# Patient Record
Sex: Male | Born: 1955 | Race: White | Hispanic: No | Marital: Married | Smoking: Former smoker
Health system: Southern US, Community
[De-identification: ages and names within clinical notes are randomized; demographics above are authoritative.]

## PROBLEM LIST (undated history)

## (undated) DIAGNOSIS — I2510 Atherosclerotic heart disease of native coronary artery without angina pectoris: Secondary | ICD-10-CM

## (undated) HISTORY — PX: PERCUTANEOUS CORONARY STENT INTERVENTION (PCI-S): SHX6016

## (undated) HISTORY — PX: HERNIA REPAIR: SHX51

---

## 2004-09-28 ENCOUNTER — Ambulatory Visit: Payer: Self-pay | Admitting: Gastroenterology

## 2004-11-17 ENCOUNTER — Ambulatory Visit: Payer: Self-pay | Admitting: Gastroenterology

## 2005-08-22 ENCOUNTER — Other Ambulatory Visit: Payer: Self-pay

## 2005-08-22 ENCOUNTER — Emergency Department: Payer: Self-pay | Admitting: Internal Medicine

## 2005-09-06 ENCOUNTER — Encounter: Payer: Self-pay | Admitting: Family Medicine

## 2005-09-15 ENCOUNTER — Encounter: Payer: Self-pay | Admitting: Family Medicine

## 2005-10-16 ENCOUNTER — Encounter: Payer: Self-pay | Admitting: Family Medicine

## 2007-05-08 ENCOUNTER — Ambulatory Visit: Payer: Self-pay | Admitting: Family Medicine

## 2008-09-23 ENCOUNTER — Ambulatory Visit: Payer: Self-pay | Admitting: Family Medicine

## 2008-09-25 ENCOUNTER — Ambulatory Visit: Payer: Self-pay | Admitting: Family Medicine

## 2014-01-03 ENCOUNTER — Ambulatory Visit: Payer: Self-pay | Admitting: Gastroenterology

## 2014-01-14 ENCOUNTER — Ambulatory Visit: Payer: Self-pay | Admitting: Gastroenterology

## 2014-01-29 ENCOUNTER — Ambulatory Visit: Payer: Self-pay | Admitting: Gastroenterology

## 2014-03-21 ENCOUNTER — Ambulatory Visit: Payer: Self-pay | Admitting: Cardiology

## 2016-05-10 ENCOUNTER — Encounter: Payer: Self-pay | Admitting: *Deleted

## 2016-05-10 ENCOUNTER — Ambulatory Visit (INDEPENDENT_AMBULATORY_CARE_PROVIDER_SITE_OTHER): Payer: Worker's Compensation

## 2016-05-10 ENCOUNTER — Ambulatory Visit
Admission: EM | Admit: 2016-05-10 | Discharge: 2016-05-10 | Disposition: A | Discharge status: Home / Self Care | Payer: Worker's Compensation

## 2016-05-10 DIAGNOSIS — M4316 Spondylolisthesis, lumbar region: Secondary | ICD-10-CM

## 2016-05-10 DIAGNOSIS — M5416 Radiculopathy, lumbar region: Secondary | ICD-10-CM | POA: Diagnosis not present

## 2016-05-10 HISTORY — DX: Atherosclerotic heart disease of native coronary artery without angina pectoris: I25.10

## 2016-05-10 MED ORDER — KETOROLAC TROMETHAMINE 60 MG/2ML IM SOLN
60.0000 mg | Freq: Once | INTRAMUSCULAR | Status: AC
  Administered 2016-05-10: 60 mg via INTRAMUSCULAR

## 2016-05-10 MED ORDER — NAPROXEN 500 MG PO TABS: 500.0000 mg | ORAL_TABLET | Freq: Two times a day (BID) | ORAL | 0 refills | Status: DC

## 2016-05-10 MED ORDER — TIZANIDINE HCL 4 MG PO TABS: 4.0000 mg | ORAL_TABLET | Freq: Four times a day (QID) | ORAL | 0 refills | Status: DC | PRN

## 2016-05-10 NOTE — ED Triage Notes (Signed)
Sudden onset low back pain while lifting a chair at work last week. Pain has been persistent through week but became worse last night and this morning. Pain is left sided and radiates to left leg.

## 2016-05-11 NOTE — ED Provider Notes (Signed)
CSN: [id number]     Arrival date & time 05/10/16  4098 History   First MD Initiated Contact with Patient 05/10/16 [phone]     Chief Complaint  Patient presents with  . Back Pain   (Consider location/radiation/quality/duration/timing/severity/associated sxs/prior Treatment) HPI  Patient presents with 1 week H/O LBP with radiation into his left leg. Was lifting a couch at work when plastic covering slipped and  he strained to prevent it from falling as he was attempting to stand the couch on end. Since then he has had the back pain radiating into his left leg. Denies incontinence.      Past Medical History:  Diagnosis Date  . Coronary artery disease    Past Surgical History:  Procedure Laterality Date  . HERNIA REPAIR    . PERCUTANEOUS CORONARY STENT INTERVENTION (PCI-S)     Family History  Problem Relation Age of Onset  . Osteoarthritis Mother   . Cancer Father   . Stroke Father    Social History  Substance Use Topics  . Smoking status: Former Games developer  . Smokeless tobacco: Never Used  . Alcohol use No    Review of Systems  Constitutional: Positive for activity change. Negative for appetite change, chills, fatigue and fever.  Eyes: Positive for photophobia.  Musculoskeletal: Positive for back pain and myalgias.  All other systems reviewed and are negative.   Allergies  Review of patient's allergies indicates no known allergies.  Home Medications   Prior to Admission medications   Medication Sig Start Date End Date Taking? Authorizing Provider  aspirin EC 81 MG tablet Take 81 mg by mouth daily.   Yes Historical Provider, MD  fenofibrate (TRICOR) 48 MG tablet Take 48 mg by mouth daily.   Yes Historical Provider, MD  rosuvastatin (CRESTOR) 20 MG tablet Take 20 mg by mouth daily.   Yes Historical Provider, MD  naproxen (NAPROSYN) 500 MG tablet Take 1 tablet (500 mg total) by mouth 2 (two) times daily with a meal. 05/10/16   Lutricia Feil, PA-C  tiZANidine (ZANAFLEX) 4  MG tablet Take 1 tablet (4 mg total) by mouth every 6 (six) hours as needed for muscle spasms. 05/10/16   Lutricia Feil, PA-C   Meds Ordered and Administered this Visit   Medications  ketorolac (TORADOL) injection 60 mg (60 mg Intramuscular Given 05/10/16 0959)    BP (!) 150/85 (BP Location: Left Arm)   Pulse 82   Temp 97.7 F (36.5 C) (Oral)   Resp 16   Ht 5\' 10"  (1.778 m)   Wt 197 lb (89.4 kg)   SpO2 99%   BMI 28.27 kg/m  No data found.   Physical Exam  Constitutional: He appears well-developed and well-nourished. No distress.  HENT:  Head: Normocephalic and atraumatic.  Eyes: EOM are normal. Pupils are equal, round, and reactive to light.  Neck: Normal range of motion. Neck supple.  Musculoskeletal: He exhibits tenderness.  Level pelvis in stance. Good ROM. Muscles strong. SLR positive at 80 degrees on left with leg radiation. DTR 2/4 symmetrical.  Skin: He is not diaphoretic.  Nursing note and vitals reviewed.   Urgent Care Course   Clinical Course    Procedures (including critical care time)  Labs Review Labs Reviewed - No data to display  Imaging Review Dg Lumbar Spine Complete  Result Date: 05/10/2016 CLINICAL DATA:  Left lumbar radiculopathy EXAM: LUMBAR SPINE - COMPLETE 4+ VIEW COMPARISON:  09/25/2008 FINDINGS: Grade 1 anterior listhesis L4-5 has progressed in the  interval. This appears degenerative without pars defect identified. Remaining alignment normal. Schmorl's node superior endplate of L1 unchanged. Disc calcification L1-2 unchanged. Anterior spurring L2-3 and L3-4 unchanged. Mild disc space narrowing L5-S1. Negative for fracture or mass.  No pars defect. IMPRESSION: Progressive anterior listhesis at L4-5 due to disc and facet degeneration. Negative for fracture. Electronically Signed   By: Marlan Palauharles  Clark M.D.   On: 05/10/2016 10:35     Visual Acuity Review  Right Eye Distance:   Left Eye Distance:   Bilateral Distance:    Right Eye Near:    Left Eye Near:    Bilateral Near:     Medications  ketorolac (TORADOL) injection 60 mg (60 mg Intramuscular Given 05/10/16 0959)      MDM   1. Acute left lumbar radiculopathy   2. Spondylolisthesis of lumbar region    Discharge Medication List as of 05/10/2016 11:16 AM    START taking these medications   Details  naproxen (NAPROSYN) 500 MG tablet Take 1 tablet (500 mg total) by mouth 2 (two) times daily with a meal., Starting Tue 05/10/2016, Normal    tiZANidine (ZANAFLEX) 4 MG tablet Take 1 tablet (4 mg total) by mouth every 6 (six) hours as needed for muscle spasms., Starting Tue 05/10/2016, Normal      Plan: 1. Test/x-ray results and diagnosis reviewed with patient 2. rx as per orders; risks, benefits, potential side effects reviewed with patient 3. Recommend supportive treatment with sx.avoidance,rest. Heat ice PRN. [facility] 1 week. 4. F/u prn if symptoms worsen or don't improve     Lutricia FeilWilliam P Ranbir Chew, PA-C 05/11/16 [phone]

## 2016-05-12 ENCOUNTER — Telehealth: Payer: Self-pay | Admitting: *Deleted

## 2016-05-24 ENCOUNTER — Ambulatory Visit
Admission: EM | Admit: 2016-05-24 | Discharge: 2016-05-24 | Disposition: A | Discharge status: Home / Self Care | Payer: Worker's Compensation

## 2016-05-24 DIAGNOSIS — Z09 Encounter for follow-up examination after completed treatment for conditions other than malignant neoplasm: Secondary | ICD-10-CM

## 2016-05-24 DIAGNOSIS — M545 Low back pain, unspecified: Secondary | ICD-10-CM

## 2016-05-24 NOTE — ED Provider Notes (Signed)
[facility] ____________________________________________  Time seen: Approximately 8:28 AM  I have reviewed the triage vital signs and the nursing notes.   HISTORY  Chief Complaint Back Pain (WC Follow up)   HPI Tyler Benson is a 60 y.o. male   presents for low back pain Worker's Comp. injury follow-up. Patient reports approximately 2 weeks ago he injured his low back one at work. Patient reports 2 weeks ago he was lifting a couch at work and the plastic slipped causing him to strain his low back. Patient reports that he was seen in urgent care 2 weeks ago and presents today for follow-up.  Patient reports that he has been at work but on a lighter duty. Patient states that he feels fine now and is ready to return to work. Patient states that he very rarely will have any low back pain, but does continue to take the daily naproxen. States occasionally takes muscle relaxant at night only. He denies any other fall, injury or trauma. Denies any new injury. Denies any urinary or bowel retention or incontinence. Denies any paresthesias. Denies any pain radiations. Patient states no pain at this time and requests return back to work without any restrictions. Patient reports he did not follow up with Francesco Sorommie Anne Moore due to not hearing back from his company.   Past Medical History:  Diagnosis Date  . Coronary artery disease     There are no active problems to display for this patient.   Past Surgical History:  Procedure Laterality Date  . HERNIA REPAIR    . PERCUTANEOUS CORONARY STENT INTERVENTION (PCI-S)      Current Outpatient Rx  . Order #: [id number] Class: Historical Med  . Order #: [id number] Class: Historical Med  . Order #: [id number] Class: Normal  . Order #: [id number] Class: Historical Med  . Order #: [id number] Class: Normal    No current facility-administered medications for this encounter.   Current Outpatient Prescriptions:  .  aspirin EC 81 MG tablet, Take  81 mg by mouth daily., Disp: , Rfl:  .  fenofibrate (TRICOR) 48 MG tablet, Take 48 mg by mouth daily., Disp: , Rfl:  .  naproxen (NAPROSYN) 500 MG tablet, Take 1 tablet (500 mg total) by mouth 2 (two) times daily with a meal., Disp: 60 tablet, Rfl: 0 .  rosuvastatin (CRESTOR) 20 MG tablet, Take 20 mg by mouth daily., Disp: , Rfl:  .  tiZANidine (ZANAFLEX) 4 MG tablet, Take 1 tablet (4 mg total) by mouth every 6 (six) hours as needed for muscle spasms., Disp: 30 tablet, Rfl: 0  Allergies Patient has no known allergies.  Family History  Problem Relation Age of Onset  . Osteoarthritis Mother   . Cancer Father   . Stroke Father     Social History Social History  Substance Use Topics  . Smoking status: Former Games developermoker  . Smokeless tobacco: Never Used  . Alcohol use No    Review of Systems Constitutional: No fever/chills Eyes: No visual changes. ENT: No sore throat. Cardiovascular: Denies chest pain. Respiratory: Denies shortness of breath. Gastrointestinal: No abdominal pain.  No nausea, no vomiting.  No diarrhea.  No constipation. Genitourinary: Negative for dysuria. Musculoskeletal: Negative for back pain. Skin: Negative for rash. Neurological: Negative for headaches, focal weakness or numbness.  10-point ROS otherwise negative.  ____________________________________________   PHYSICAL EXAM:  VITAL SIGNS: ED Triage Vitals  Enc Vitals Group     BP 05/24/16 0824 (!) 146/85     Pulse Rate 05/24/16 0824  80     Resp 05/24/16 0824 18     Temp 05/24/16 0824 98.1 F (36.7 C)     Temp Source 05/24/16 0824 Oral     SpO2 05/24/16 0824 100 %     Weight 05/24/16 0825 197 lb (89.4 kg)     Height 05/24/16 0825 5\' 10"  (1.778 m)     Head Circumference --      Peak Flow --      Pain Score 05/24/16 0826 1     Pain Loc --      Pain Edu? --      Excl. in GC? --     Constitutional: Alert and oriented. Well appearing and in no acute distress. Eyes: Conjunctivae are normal. PERRL.  EOMI. ENT      Head: Normocephalic and atraumatic.      Mouth/Throat: Mucous membranes are moist. Cardiovascular: Normal rate, regular rhythm. Grossly normal heart sounds.  Good peripheral circulation. Respiratory: Normal respiratory effort without tachypnea nor retractions. Breath sounds are clear and equal bilaterally. No wheezes/rales/rhonchi.. Gastrointestinal: Soft and nontender. No distention. No CVA tenderness. Musculoskeletal:  Nontender with normal range of motion in all extremities. No midline cervical, thoracic or lumbar tenderness to palpation. Steady gait. Changes positions quickly without discomfort. No pain elicited with lumbar flexion, lumbar extension, lumbar right and left rotation. No pain with bilateral standing knee lifts. Lateral plantar flexion and dorsiflexion strong and equal. Bilateral distal pedal pulses equal and easily palpated. Able to stand to squat to stand without discomfort.       Right lower leg:  No tenderness or edema.      Left lower leg:  No tenderness or edema.  Neurologic:  Normal speech and language. No gross focal neurologic deficits are appreciated. Speech is normal. No gait instability.  Skin:  Skin is warm, dry and intact. No rash noted. Psychiatric: Mood and affect are normal. Speech and behavior are normal. Patient exhibits appropriate insight and judgment   ___________________________________________   LABS (all labs ordered are listed, but only abnormal results are displayed)  Labs Reviewed - No data to display  PROCEDURES Procedures   INITIAL IMPRESSION / ASSESSMENT AND PLAN / ED COURSE  Pertinent labs & imaging results that were available during my care of the patient were reviewed by me and considered in my medical decision making (see chart for details).  Well-appearing patient. No acute distress. Follow-up for low back pain from injury at work a few weeks ago. No pain on exam, full range of motion, patient feels well. Patient  requests return to work as a full activity without any rash actions. Work note given that patient can return without restrictions. Follow up with Tommie Maximiano CossAnne Moore as needed.  Discussed follow up with Primary care physician this week. Discussed follow up and return parameters including no resolution or any worsening concerns. Patient verbalized understanding and agreed to plan.   ____________________________________________   FINAL CLINICAL IMPRESSION(S) / ED DIAGNOSES  Final diagnoses:  Low back pain without sciatica, unspecified back pain laterality, unspecified chronicity  Follow up     Discharge Medication List as of 05/24/2016  8:56 AM      Note: This dictation was prepared with Dragon dictation along with smaller phrase technology. Any transcriptional errors that result from this process are unintentional.    Clinical Course       Renford DillsLindsey Zakai Gonyea, NP 05/24/16 1038

## 2016-05-24 NOTE — ED Triage Notes (Signed)
Patient is here to f/u after a WC back injury on 05/11/2016. He states that he is  much better and only using the muscle relaxer at night and naproxen BID with meals.

## 2016-05-24 NOTE — Discharge Instructions (Signed)
Use good body mechanics. Follow up with Francesco Sorommie Anne Moore as needed for recurrence of pain.

## 2016-05-27 ENCOUNTER — Telehealth: Payer: Self-pay | Admitting: *Deleted

## 2017-09-20 ENCOUNTER — Ambulatory Visit (INDEPENDENT_AMBULATORY_CARE_PROVIDER_SITE_OTHER): Payer: 59

## 2017-09-20 ENCOUNTER — Other Ambulatory Visit: Payer: Self-pay

## 2017-09-20 ENCOUNTER — Ambulatory Visit
Admission: EM | Admit: 2017-09-20 | Discharge: 2017-09-20 | Disposition: A | Discharge status: Home / Self Care | Payer: 59 | Attending: Family Medicine | Admitting: Family Medicine

## 2017-09-20 DIAGNOSIS — R05 Cough: Secondary | ICD-10-CM

## 2017-09-20 DIAGNOSIS — R197 Diarrhea, unspecified: Secondary | ICD-10-CM | POA: Diagnosis not present

## 2017-09-20 DIAGNOSIS — M791 Myalgia, unspecified site: Secondary | ICD-10-CM | POA: Diagnosis not present

## 2017-09-20 DIAGNOSIS — R69 Illness, unspecified: Secondary | ICD-10-CM

## 2017-09-20 DIAGNOSIS — R059 Cough, unspecified: Secondary | ICD-10-CM

## 2017-09-20 DIAGNOSIS — R110 Nausea: Secondary | ICD-10-CM

## 2017-09-20 DIAGNOSIS — J111 Influenza due to unidentified influenza virus with other respiratory manifestations: Secondary | ICD-10-CM

## 2017-09-20 MED ORDER — OSELTAMIVIR PHOSPHATE 75 MG PO CAPS: 75.0000 mg | ORAL_CAPSULE | Freq: Two times a day (BID) | ORAL | 0 refills | Status: AC

## 2017-09-20 MED ORDER — BENZONATATE 100 MG PO CAPS: 100.0000 mg | ORAL_CAPSULE | Freq: Three times a day (TID) | ORAL | 0 refills | Status: DC | PRN

## 2017-09-20 MED ORDER — HYDROCOD POLST-CPM POLST ER 10-8 MG/5ML PO SUER: 5.0000 mL | Freq: Every evening | ORAL | 0 refills | Status: AC | PRN

## 2017-09-20 NOTE — ED Triage Notes (Signed)
Patient complains of cough, lack of appetite or thirst, chills, diarrhea that started originally 3 weeks ago but symptoms returned on Monday evening.

## 2017-09-20 NOTE — Discharge Instructions (Signed)
Take medication as prescribed. Rest. Drink plenty of fluids.  ° °Follow up with your primary care physician this week as needed. Return to Urgent care for new or worsening concerns.  ° °

## 2017-09-20 NOTE — ED Provider Notes (Signed)
[facility] ____________________________________________  Time seen: Approximately 1130 AM  I have reviewed the triage vital signs and the nursing notes.   HISTORY  Chief Complaint Cough  HPI Tyler Benson is a 62 y.o. male presenting for evaluation of nasal congestion, nasal drainage, cough, some diarrhea, some nausea, chills and body aches that started on Monday evening.  Reports quick in onset of symptoms.  States scratchy throat, no persistent sore throat.  Denies vomiting.  States diarrhea has been intermittent, and worse due to coughing.  Denies accompanying abdominal pain.  States decreased appetite, has continue to drink fluids.  Reports granddaughter recently sick with some similar complaints.  States he had some coughing and congestion symptoms approximately 3 weeks ago in which he continued with a very mild nagging nonproductive cough for the last 2 weeks, but reports he had felt much better.  States fever yesterday between 101 and 102, has taken some over-the-counter Tylenol and congestion medication.  Denies other aggravating or alleviating factors. Denies chest pain, shortness of breath, abdominal pain, or rash. Denies recent sickness. Denies recent antibiotic use.   PCP: Gavin Potters   Past Medical History:  Diagnosis Date  . Coronary artery disease     There are no active problems to display for this patient.   Past Surgical History:  Procedure Laterality Date  . HERNIA REPAIR    . PERCUTANEOUS CORONARY STENT INTERVENTION (PCI-S)       No current facility-administered medications for this encounter.   Current Outpatient Medications:  .  aspirin EC 81 MG tablet, Take 81 mg by mouth daily., Disp: , Rfl:  .  benzonatate (TESSALON PERLES) 100 MG capsule, Take 1 capsule (100 mg total) by mouth 3 (three) times daily as needed for cough., Disp: 15 capsule, Rfl: 0 .  chlorpheniramine-HYDROcodone (TUSSIONEX PENNKINETIC ER) 10-8 MG/5ML SUER, Take 5 mLs by mouth  at bedtime as needed for cough. do not drive or operate machinery while taking as can cause drowsiness., Disp: 75 mL, Rfl: 0 .  oseltamivir (TAMIFLU) 75 MG capsule, Take 1 capsule (75 mg total) by mouth every 12 (twelve) hours., Disp: 10 capsule, Rfl: 0  Allergies Patient has no known allergies.  Family History  Problem Relation Age of Onset  . Osteoarthritis Mother   . Cancer Father   . Stroke Father     Social History Social History   Tobacco Use  . Smoking status: Former Games developer  . Smokeless tobacco: Never Used  Substance Use Topics  . Alcohol use: No  . Drug use: No    Review of Systems Constitutional: As above. ENT: As above.  Cardiovascular: Denies chest pain. Respiratory: Denies shortness of breath. Gastrointestinal: No abdominal pain. As above.  Musculoskeletal: Negative for back pain. Skin: Negative for rash.  ____________________________________________   PHYSICAL EXAM:  VITAL SIGNS: ED Triage Vitals  Enc Vitals Group     BP 09/20/17 1018 (!) 143/80     Pulse Rate 09/20/17 1018 (!) 108     Resp 09/20/17 1018 18     Temp 09/20/17 1018 99.8 F (37.7 C)     Temp Source 09/20/17 1018 Oral     SpO2 09/20/17 1018 98 %     Weight 09/20/17 1015 200 lb (90.7 kg)     Height 09/20/17 1015 5\' 10"  (1.778 m)     Head Circumference --      Peak Flow --      Pain Score 09/20/17 1015 8     Pain Loc --  Pain Edu? --      Excl. in GC? --     Constitutional: Alert and oriented. Well appearing and in no acute distress. Eyes: Conjunctivae are normal.  Head: Atraumatic. No sinus tenderness to palpation. No swelling. No erythema.  Ears: no erythema, normal TMs bilaterally.   Nose:Nasal congestion with clear rhinorrhea  Mouth/Throat: Mucous membranes are moist. No pharyngeal erythema. No tonsillar swelling or exudate.  Neck: No stridor.  No cervical spine tenderness to palpation. Hematological/Lymphatic/Immunilogical: No cervical lymphadenopathy. Cardiovascular:  Normal rate, regular rhythm. Grossly normal heart sounds.  Good peripheral circulation. Respiratory: Normal respiratory effort.  No retractions. No wheezes, rales or rhonchi. Good air movement.  Gastrointestinal: Soft and nontender.No CVA tenderness. Musculoskeletal: Ambulatory with steady gait.  Neurologic:  Normal speech and language. No gait instability. Skin:  Skin appears warm, dry and intact. No rash noted. Psychiatric: Mood and affect are normal. Speech and behavior are normal.  ___________________________________________   LABS (all labs ordered are listed, but only abnormal results are displayed)  Labs Reviewed - No data to display  RADIOLOGY  Dg Chest 2 View  Result Date: 09/20/2017 CLINICAL DATA:  Cough, fever, lack of appetite, chills, diarrhea, symptoms originally 3 weeks ago but recurred 3 days ago EXAM: CHEST - 2 VIEW COMPARISON:  08/22/2005 FINDINGS: Normal heart size, mediastinal contours, and pulmonary vascularity. Lungs clear. No pleural effusion or pneumothorax. Question RIGHT nipple shadow. Degenerative disc disease changes thoracic spine. IMPRESSION: Question RIGHT nipple shadow; repeat PA chest radiograph with nipple markers recommended to exclude pulmonary nodule. Remainder of exam unremarkable. Electronically Signed   By: Ulyses SouthwardMark  Boles M.D.   On: 09/20/2017 12:03   ____________________________________________   PROCEDURES Procedures   INITIAL IMPRESSION / ASSESSMENT AND PLAN / ED COURSE  Pertinent labs & imaging results that were available during my care of the patient were reviewed by me and considered in my medical decision making (see chart for details).  Overall well-appearing patient.  Suspect influenza.  Patient does have some coarse rhonchi with recent sickness  with past medical history, will evaluate chest x-ray to exclude pneumonia.  Chest x-ray results as above and discussed with patient.  Patient encouraged to follow-up with his primary to exclude  pulmonary nodule, patient agrees to this.  Will treat patient for suspected influenza with Tamiflu, as needed Tussionex and PRN Tessalon Perles.  Encourage rest, fluids, supportive care.Discussed indication, risks and benefits of medications with patient.  Discussed follow up with Primary care physician this week. Discussed follow up and return parameters including no resolution or any worsening concerns. Patient verbalized understanding and agreed to plan.   ____________________________________________   FINAL CLINICAL IMPRESSION(S) / ED DIAGNOSES  Final diagnoses:  Influenza-like illness  Cough     ED Discharge Orders        Ordered    oseltamivir (TAMIFLU) 75 MG capsule  Every 12 hours     09/20/17 1214    chlorpheniramine-HYDROcodone (TUSSIONEX PENNKINETIC ER) 10-8 MG/5ML SUER  At bedtime PRN     09/20/17 1214    benzonatate (TESSALON PERLES) 100 MG capsule  3 times daily PRN     09/20/17 1214       Note: This dictation was prepared with Dragon dictation along with smaller phrase technology. Any transcriptional errors that result from this process are unintentional.         Renford DillsMiller, Johnetta Sloniker, NP 09/20/17 1254

## 2017-09-23 ENCOUNTER — Ambulatory Visit (INDEPENDENT_AMBULATORY_CARE_PROVIDER_SITE_OTHER): Payer: 59

## 2017-09-23 ENCOUNTER — Other Ambulatory Visit: Payer: Self-pay

## 2017-09-23 ENCOUNTER — Ambulatory Visit
Admission: EM | Admit: 2017-09-23 | Discharge: 2017-09-23 | Disposition: A | Discharge status: Home / Self Care | Payer: 59 | Attending: Family Medicine | Admitting: Family Medicine

## 2017-09-23 DIAGNOSIS — J111 Influenza due to unidentified influenza virus with other respiratory manifestations: Secondary | ICD-10-CM | POA: Diagnosis not present

## 2017-09-23 DIAGNOSIS — R05 Cough: Secondary | ICD-10-CM

## 2017-09-23 NOTE — ED Provider Notes (Signed)
[facility]    CSN: [id number] Arrival date & time: 09/23/17  4098     History   Chief Complaint Chief Complaint  Patient presents with  . Follow-up    HPI Tyler Benson is a 62 y.o. male.   HPI  [name] returns today that he was seen here on 09/20/2017 and diagnosed with the flu.  He is improved in the way he feels but states that he has terrible burning sensation in his chest when he takes in a deep breath.  He has been coughing that has not been productive.  Is afebrile today pulse of 83 O2 sats are 94% on room air.  Evidently his x-ray showed a lesion thought to be a nipple shadow but a pulmonary nodule cannot be ruled out.  Radiologist had recommended that he have nipple markers placed to delineate.       Past Medical History:  Diagnosis Date  . Coronary artery disease     There are no active problems to display for this patient.   Past Surgical History:  Procedure Laterality Date  . HERNIA REPAIR    . PERCUTANEOUS CORONARY STENT INTERVENTION (PCI-S)         Home Medications    Prior to Admission medications   Medication Sig Start Date End Date Taking? Authorizing Provider  aspirin EC 81 MG tablet Take 81 mg by mouth daily.   Yes [provider]  chlorpheniramine-HYDROcodone (TUSSIONEX PENNKINETIC ER) 10-8 MG/5ML SUER Take 5 mLs by mouth at bedtime as needed for cough. do not drive or operate machinery while taking as can cause drowsiness. 09/20/17  Yes Renford Dills, NP  oseltamivir (TAMIFLU) 75 MG capsule Take 1 capsule (75 mg total) by mouth every 12 (twelve) hours. 09/20/17  Yes Renford Dills, NP    Family History Family History  Problem Relation Age of Onset  . Osteoarthritis Mother   . Cancer Father   . Stroke Father     Social History Social History   Tobacco Use  . Smoking status: Former Games developer  . Smokeless tobacco: Never Used  Substance Use Topics  . Alcohol use: No  . Drug use: No     Allergies   Patient  has no known allergies.   Review of Systems Review of Systems  Constitutional: Positive for activity change. Negative for chills, fatigue and fever.  Respiratory: Positive for cough and shortness of breath.   All other systems reviewed and are negative.    Physical Exam Triage Vital Signs ED Triage Vitals  Enc Vitals Group     BP 09/23/17 0921 119/77     Pulse Rate 09/23/17 0921 83     Resp --      Temp 09/23/17 0921 98.2 F (36.8 C)     Temp Source 09/23/17 0921 Oral     SpO2 09/23/17 0921 94 %     Weight 09/23/17 0915 200 lb (90.7 kg)     Height 09/23/17 0915 5\' 10"  (1.778 m)     Head Circumference --      Peak Flow --      Pain Score 09/23/17 0915 8     Pain Loc --      Pain Edu? --      Excl. in GC? --    No data found.  Updated Vital Signs BP 119/77 (BP Location: Left Arm)   Pulse 83   Temp 98.2 F (36.8 C) (Oral)   Ht 5\' 10"  (1.778 m)  Wt 200 lb (90.7 kg)   SpO2 94%   BMI 28.70 kg/m   Visual Acuity Right Eye Distance:   Left Eye Distance:   Bilateral Distance:    Right Eye Near:   Left Eye Near:    Bilateral Near:     Physical Exam  Constitutional: He is oriented to person, place, and time. He appears well-developed and well-nourished. No distress.  HENT:  Head: Normocephalic.  Mouth/Throat: No oropharyngeal exudate.  Eyes: Pupils are equal, round, and reactive to light.  Neck: Normal range of motion.  Pulmonary/Chest: Effort normal. He has rales.  Fine crackles left base.  Musculoskeletal: Normal range of motion.  Lymphadenopathy:    He has no cervical adenopathy.  Neurological: He is alert and oriented to person, place, and time.  Skin: Skin is warm and dry. He is not diaphoretic.  Psychiatric: He has a normal mood and affect. His behavior is normal. Judgment and thought content normal.  Nursing note and vitals reviewed.    UC Treatments / Results  Labs (all labs ordered are listed, but only abnormal results are displayed) Labs  Reviewed - No data to display  EKG  EKG Interpretation None       Radiology Dg Chest 2 View  Result Date: 09/23/2017 CLINICAL DATA:  Cough for 3 weeks. EXAM: CHEST - 2 VIEW COMPARISON:  September 20, 2017 FINDINGS: The heart size and mediastinal contours are within normal limits. Both lungs are clear. The visualized skeletal structures are unremarkable. IMPRESSION: No suspicious pulmonary nodules or masses.  No acute abnormalities. Electronically Signed   By: Gerome Samavid  Williams III M.D   On: 09/23/2017 10:37    Procedures Procedures (including critical care time)  Medications Ordered in UC Medications - No data to display   Initial Impression / Assessment and Plan / UC Course  I have reviewed the triage vital signs and the nursing notes.  Pertinent labs & imaging results that were available during my care of the patient were reviewed by me and considered in my medical decision making (see chart for details).     Plan: 1. Test/x-ray results and diagnosis reviewed with patient 2. rx as per orders; risks, benefits, potential side effects reviewed with patient 3. Recommend supportive treatment with and fluids.  Continue present course of treatment prescribed 2 days ago.  Viewed the repeat chest x-ray with the patient showing no suspicious pulmonary nodules.  Recommend he follow-up with [facility] primary for any further concerns. 4. F/u prn if symptoms worsen or don't improve   Final Clinical Impressions(s) / UC Diagnoses   Final diagnoses:  Influenza    ED Discharge Orders    None       Controlled Substance Prescriptions [place] Controlled Substance Registry consulted? Not Applicable   Lutricia FeilRoemer, William P, PA-C 09/23/17 1101

## 2017-09-23 NOTE — ED Triage Notes (Signed)
Patient was seen 09/20/17 for the flu. Patient states he was informed to come back for a follow up xray. Patient states his symptoms have improved. Patient states when he breathes he feels a burning sensation.

## 2019-01-02 IMAGING — CR DG CHEST 2V
2 series · 2 of 2 positions shown · non-contrast
Comparison: September 20, 2017

CLINICAL DATA: Cough for 3 weeks.

EXAM:
CHEST - 2 VIEW

[chest pa]
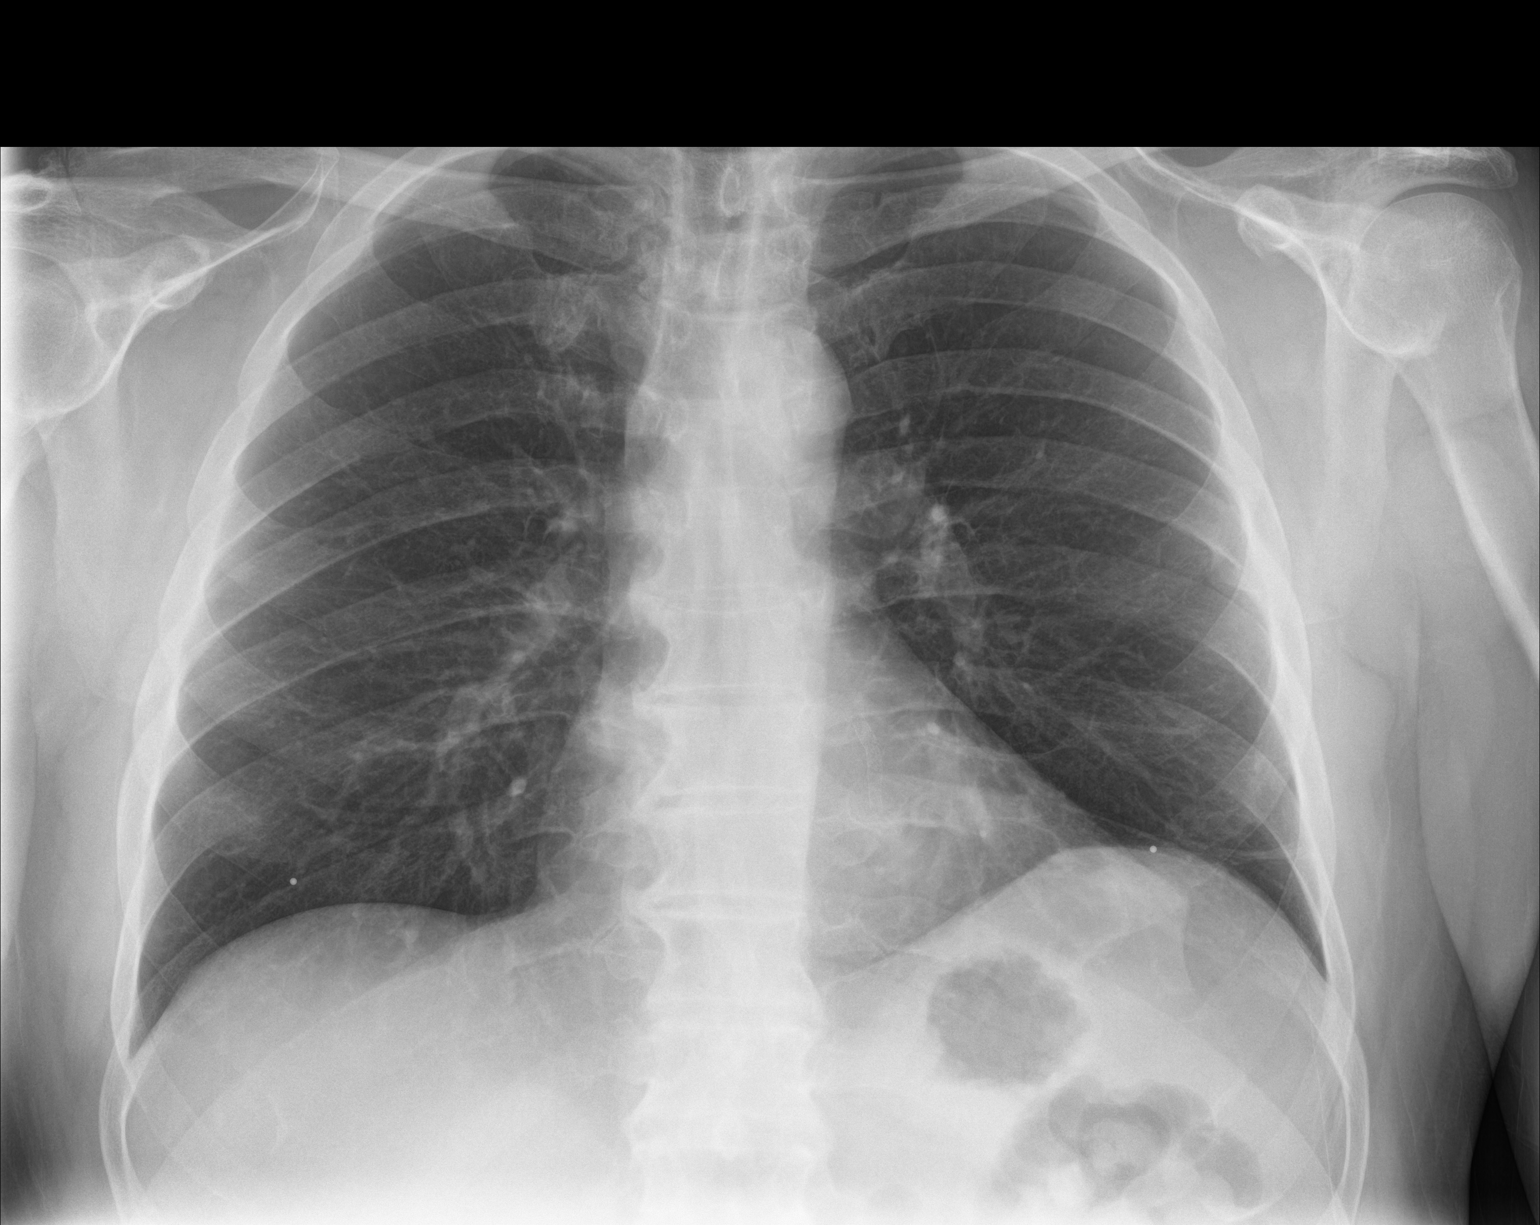

[chest lat]
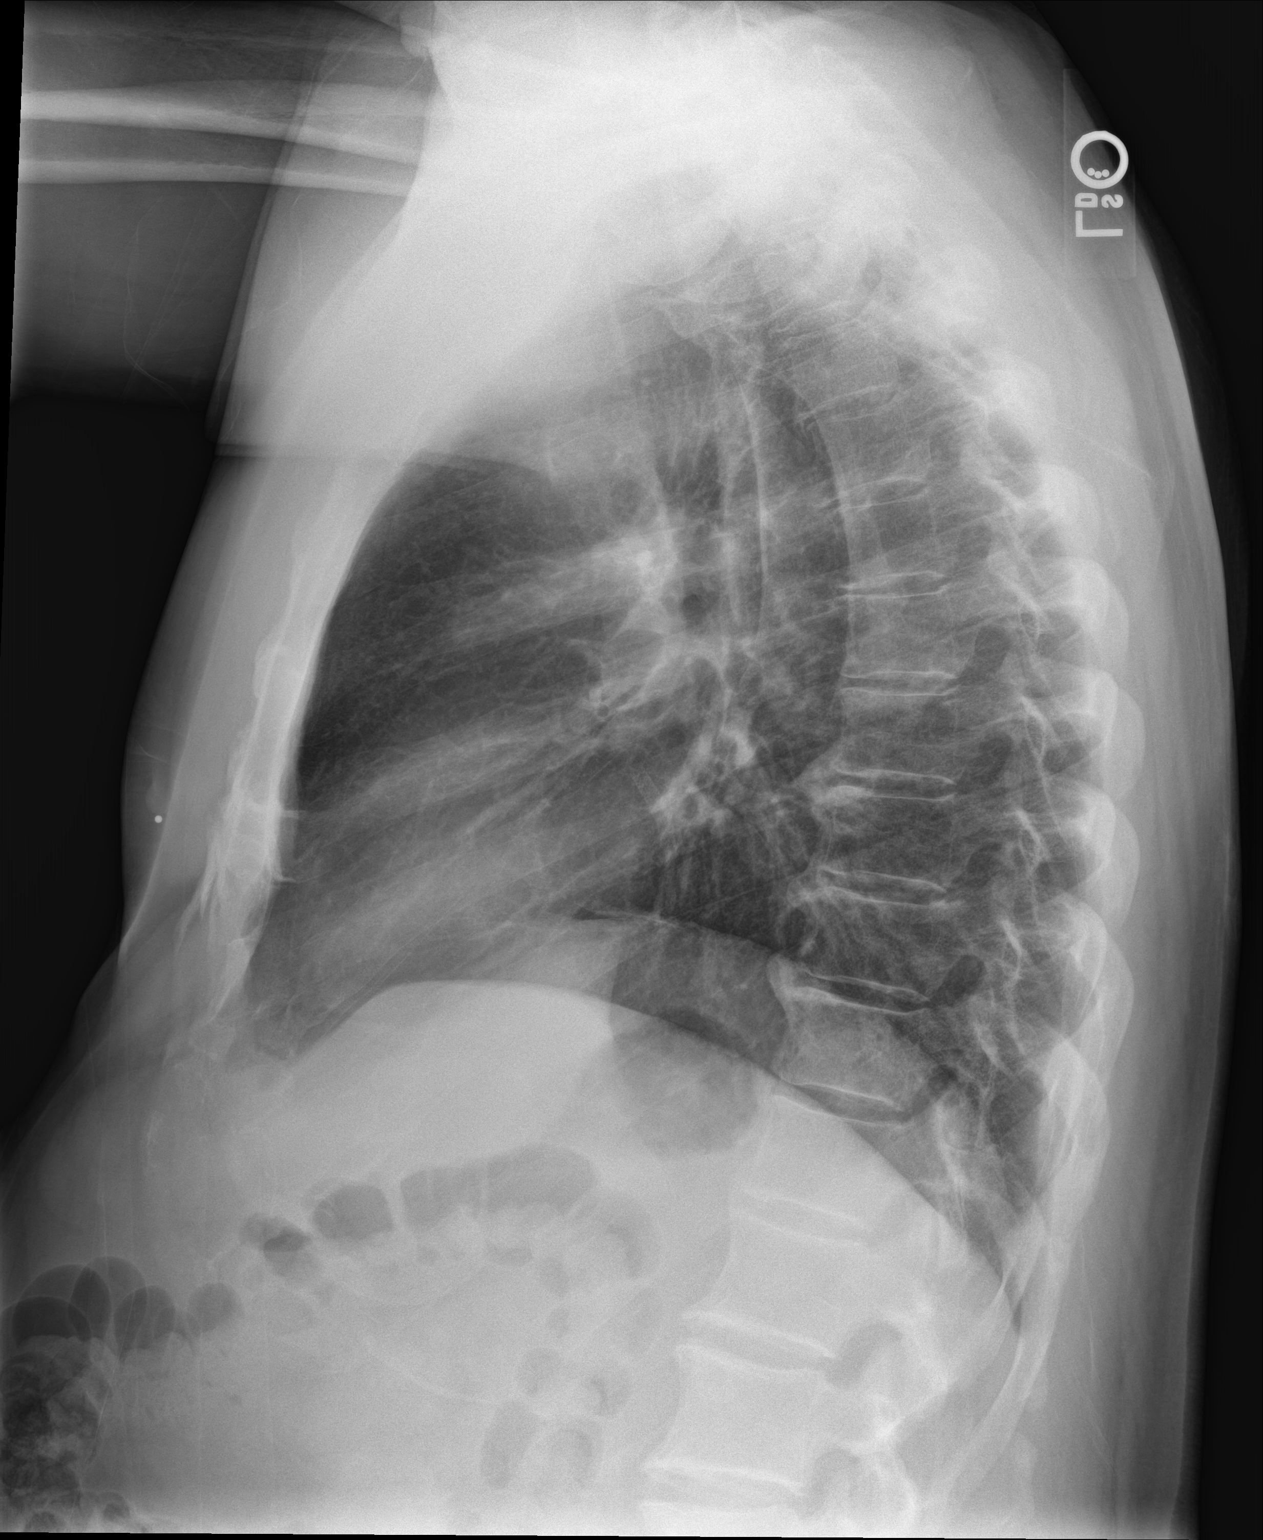

[2 of 2 positions shown; findings below may reference images not displayed]

FINDINGS: The heart size and mediastinal contours are within normal limits.
Both lungs are clear. The visualized skeletal structures are
unremarkable.
IMPRESSION: No suspicious pulmonary nodules or masses.  No acute abnormalities.

## 2020-12-16 ENCOUNTER — Ambulatory Visit
Admission: EM | Admit: 2020-12-16 | Discharge: 2020-12-16 | Disposition: A | Discharge status: Home / Self Care | Payer: 59 | Attending: Family Medicine | Admitting: Family Medicine

## 2020-12-16 ENCOUNTER — Other Ambulatory Visit: Payer: Self-pay

## 2020-12-16 ENCOUNTER — Telehealth: Payer: Self-pay

## 2020-12-16 DIAGNOSIS — R350 Frequency of micturition: Secondary | ICD-10-CM

## 2020-12-16 DIAGNOSIS — N410 Acute prostatitis: Secondary | ICD-10-CM

## 2020-12-16 DIAGNOSIS — R300 Dysuria: Secondary | ICD-10-CM | POA: Diagnosis not present

## 2020-12-16 LAB — URINALYSIS, COMPLETE (UACMP) WITH MICROSCOPIC
Bacteria, UA: NONE SEEN
Bilirubin Urine: NEGATIVE
Glucose, UA: NEGATIVE mg/dL
Hgb urine dipstick: NEGATIVE
Ketones, ur: NEGATIVE mg/dL
Leukocytes,Ua: NEGATIVE
Nitrite: NEGATIVE
Protein, ur: NEGATIVE mg/dL
Specific Gravity, Urine: >1.03 — ABNORMAL HIGH (ref 1.005–1.030)
pH: 5 (ref 5.0–8.0)

## 2020-12-16 MED ORDER — CIPROFLOXACIN HCL 500 MG PO TABS: 500.0000 mg | ORAL_TABLET | Freq: Two times a day (BID) | ORAL | 0 refills | Status: AC

## 2020-12-16 MED ORDER — CIPROFLOXACIN HCL 500 MG PO TABS: 500.0000 mg | ORAL_TABLET | Freq: Two times a day (BID) | ORAL | 0 refills | Status: DC

## 2020-12-16 NOTE — Discharge Instructions (Signed)
The urine test is normal, but I believe you have a prostate infection.  I will send the urine for culture.  Someone may contact you with the results and advised a different antibiotic, but if they advise you to stop taking the antibiotic please continue to take it.  If any symptoms worsen or you are still having symptoms after you finish the antibiotics, you should be seen again.

## 2020-12-16 NOTE — ED Provider Notes (Signed)
[facility]    CSN: [id number] Arrival date & time: 12/16/20  1128      History   Chief Complaint Chief Complaint  Patient presents with  . Urinary Frequency    HPI Tyler Benson is a 65 y.o. male presenting with approximately 1 week history of urinary frequency and urgency as well as rectal and perineal pain.  Patient says over the past couple days he has had some slight burning with urination.  He denies any blood in the urine, abdominal or pelvic pain, constipation, diarrhea or blood in the stool.  Patient with a similar symptoms in the past when he had a mild bladder infection.  Patient not currently sexually active and denies any concerns for possible STIs.  He has not had any fevers or fatigue.  No back pain.  No urethral discharge or testicular pain or swelling.  No other concerns.  HPI  Past Medical History:  Diagnosis Date  . Coronary artery disease     There are no problems to display for this patient.   Past Surgical History:  Procedure Laterality Date  . HERNIA REPAIR    . PERCUTANEOUS CORONARY STENT INTERVENTION (PCI-S)         Home Medications    Prior to Admission medications   Medication Sig Start Date End Date Taking? Authorizing Provider  amLODipine (NORVASC) 2.5 MG tablet Take 2.5 mg by mouth daily. 10/19/20  Yes [provider]  aspirin EC 81 MG tablet Take 81 mg by mouth daily.   Yes [provider]  lisinopril (ZESTRIL) 5 MG tablet Take by mouth. 09/30/20  Yes [provider]  metoprolol succinate (TOPROL-XL) 25 MG 24 hr tablet Take 1 tablet by mouth daily. 10/27/20  Yes [provider]  nitroGLYCERIN (NITROSTAT) 0.4 MG SL tablet SMARTSIG:1 Sublingual Daily PRN 10/27/20  Yes [provider]  predniSONE (DELTASONE) 10 MG tablet Take 10 mg by mouth daily. 12/04/20  Yes [provider]  rosuvastatin (CRESTOR) 40 MG tablet Take 40 mg by mouth daily. 10/27/20  Yes [provider]   traZODone (DESYREL) 50 MG tablet Take 50 mg by mouth at bedtime. 10/26/20  Yes [provider]  chlorpheniramine-HYDROcodone (TUSSIONEX PENNKINETIC ER) 10-8 MG/5ML SUER Take 5 mLs by mouth at bedtime as needed for cough. do not drive or operate machinery while taking as can cause drowsiness. 09/20/17   Renford Dills, NP  ciprofloxacin (CIPRO) 500 MG tablet Take 1 tablet (500 mg total) by mouth 2 (two) times daily for 14 days. 12/16/20 12/30/20  Shirlee Latch, PA-C  oseltamivir (TAMIFLU) 75 MG capsule Take 1 capsule (75 mg total) by mouth every 12 (twelve) hours. 09/20/17   Renford Dills, NP    Family History Family History  Problem Relation Age of Onset  . Osteoarthritis Mother   . Cancer Father   . Stroke Father     Social History Social History   Tobacco Use  . Smoking status: Former Games developer  . Smokeless tobacco: Never Used  Vaping Use  . Vaping Use: Never used  Substance Use Topics  . Alcohol use: No  . Drug use: No     Allergies   Patient has no known allergies.   Review of Systems Review of Systems  Constitutional: Negative for fatigue and fever.  Gastrointestinal: Negative for abdominal pain, blood in stool, constipation, nausea, rectal pain and vomiting.  Genitourinary: Positive for dysuria, frequency and urgency. Negative for difficulty urinating, genital sores, hematuria, penile discharge, scrotal  swelling and testicular pain.  Musculoskeletal: Negative for back pain.  Skin: Negative for rash.  Neurological: Negative for weakness.     Physical Exam Triage Vital Signs ED Triage Vitals  Enc Vitals Group     BP 12/16/20 1147 132/75     Pulse Rate 12/16/20 1147 90     Resp 12/16/20 1147 18     Temp 12/16/20 1147 98.1 F (36.7 C)     Temp Source 12/16/20 1147 Oral     SpO2 12/16/20 1147 99 %     Weight 12/16/20 1146 194 lb (88 kg)     Height 12/16/20 1146 5\' 10"  (1.778 m)     Head Circumference --      Peak Flow --      Pain Score 12/16/20 1145 5      Pain Loc --      Pain Edu? --      Excl. in GC? --    No data found.  Updated Vital Signs BP 132/75 (BP Location: Left Arm)   Pulse 90   Temp 98.1 F (36.7 C) (Oral)   Resp 18   Ht 5\' 10"  (1.778 m)   Wt 194 lb (88 kg)   SpO2 99%   BMI 27.84 kg/m       Physical Exam Vitals and nursing note reviewed.  Constitutional:      General: He is not in acute distress.    Appearance: Normal appearance. He is well-developed. He is not ill-appearing.  HENT:     Head: Normocephalic and atraumatic.  Eyes:     General: No scleral icterus.    Conjunctiva/sclera: Conjunctivae normal.  Cardiovascular:     Rate and Rhythm: Normal rate and regular rhythm.     Heart sounds: Normal heart sounds.  Pulmonary:     Effort: Pulmonary effort is normal. No respiratory distress.     Breath sounds: Normal breath sounds.  Abdominal:     Palpations: Abdomen is soft.     Tenderness: There is no abdominal tenderness. There is no right CVA tenderness or left CVA tenderness.  Musculoskeletal:     Cervical back: Neck supple.  Skin:    General: Skin is warm and dry.  Neurological:     General: No focal deficit present.     Mental Status: He is alert. Mental status is at baseline.     Motor: No weakness.     Gait: Gait normal.  Psychiatric:        Mood and Affect: Mood normal.        Behavior: Behavior normal.        Thought Content: Thought content normal.      UC Treatments / Results  Labs (all labs ordered are listed, but only abnormal results are displayed) Labs Reviewed  URINALYSIS, COMPLETE (UACMP) WITH MICROSCOPIC - Abnormal; Notable for the following components:      Result Value   Specific Gravity, Urine >1.030 (*)    All other components within normal limits  URINE CULTURE    EKG   Radiology No results found.  Procedures Procedures (including critical care time)  Medications Ordered in UC Medications - No data to display  Initial Impression / Assessment and Plan /  UC Course  I have reviewed the triage vital signs and the nursing notes.  Pertinent labs & imaging results that were available during my care of the patient were reviewed by me and considered in my medical decision making (see chart for details).  65 year old male presenting with 1 week history of urinary frequency/urgency and recent dysuria.  No symptoms to the perineal discomfort.  Urinalysis today shows greater than 1.030 specific gravity, otherwise normal.  We will send urine for culture.  Treating patient at this time for prostatitis with Cipro.  Advised increasing fluids.  Advised to follow-up with our department as needed for any acute worsening of symptoms.  ED precautions were reviewed with patient.  Advised to follow-up with PCP if symptoms or not resolving when he finishes the medication.  Patient understanding and agreeable to plan.   Final Clinical Impressions(s) / UC Diagnoses   Final diagnoses:  Acute prostatitis  Urinary frequency  Dysuria     Discharge Instructions     The urine test is normal, but I believe you have a prostate infection.  I will send the urine for culture.  Someone may contact you with the results and advised a different antibiotic, but if they advise you to stop taking the antibiotic please continue to take it.  If any symptoms worsen or you are still having symptoms after you finish the antibiotics, you should be seen again.    ED Prescriptions    Medication Sig Dispense Auth. Provider   ciprofloxacin (CIPRO) 500 MG tablet Take 1 tablet (500 mg total) by mouth 2 (two) times daily for 14 days. 28 tablet Shirlee Latch, PA-C     PDMP not reviewed this encounter.   Shirlee Latch, PA-C 12/16/20 1337

## 2020-12-16 NOTE — ED Triage Notes (Signed)
Patient states that he has been having urinary frequency, urgency and pressure in rectum x 1 week.

## 2020-12-17 LAB — URINE CULTURE: Culture: NO GROWTH
